# Patient Record
Sex: Female | Born: 1995 | Race: Asian | Hispanic: No | Marital: Single
Health system: Southern US, Community
[De-identification: ages and names within clinical notes are randomized; demographics above are authoritative.]

---

## 2017-09-29 ENCOUNTER — Encounter (HOSPITAL_COMMUNITY): Payer: Self-pay

## 2017-09-29 ENCOUNTER — Emergency Department (HOSPITAL_COMMUNITY)
Admission: EM | Admit: 2017-09-29 | Discharge: 2017-09-29 | Disposition: A | Discharge status: Home / Self Care | Payer: Self-pay | Attending: Emergency Medicine | Admitting: Emergency Medicine

## 2017-09-29 ENCOUNTER — Other Ambulatory Visit: Payer: Self-pay

## 2017-09-29 DIAGNOSIS — Y998 Other external cause status: Secondary | ICD-10-CM | POA: Insufficient documentation

## 2017-09-29 DIAGNOSIS — Y92410 Unspecified street and highway as the place of occurrence of the external cause: Secondary | ICD-10-CM | POA: Insufficient documentation

## 2017-09-29 DIAGNOSIS — Y939 Activity, unspecified: Secondary | ICD-10-CM | POA: Insufficient documentation

## 2017-09-29 DIAGNOSIS — S161XXA Strain of muscle, fascia and tendon at neck level, initial encounter: Secondary | ICD-10-CM | POA: Insufficient documentation

## 2017-09-29 MED ORDER — METHOCARBAMOL 500 MG PO TABS: 500.0000 mg | ORAL_TABLET | Freq: Three times a day (TID) | ORAL | 0 refills | Status: DC | PRN

## 2017-09-29 MED ORDER — METHOCARBAMOL 500 MG PO TABS
1000.0000 mg | ORAL_TABLET | Freq: Once | ORAL | Status: AC
  Administered 2017-09-29: 1000 mg via ORAL
  Filled 2017-09-29: qty 2

## 2017-09-29 MED ORDER — NAPROXEN 500 MG PO TABS: 500.0000 mg | ORAL_TABLET | Freq: Two times a day (BID) | ORAL | 0 refills | Status: DC

## 2017-09-29 MED ORDER — IBUPROFEN 800 MG PO TABS
800.0000 mg | ORAL_TABLET | Freq: Once | ORAL | Status: AC
  Administered 2017-09-29: 800 mg via ORAL
  Filled 2017-09-29: qty 1
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
## 2017-09-29 NOTE — Discharge Instructions (Addendum)
Expect to be stiff and sore for several days.   Naproxen for pain.  Robaxin for muscle aches
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
## 2019-09-30 ENCOUNTER — Encounter: Payer: Self-pay | Admitting: Emergency Medicine

## 2019-09-30 ENCOUNTER — Emergency Department (HOSPITAL_COMMUNITY): Payer: 59

## 2019-09-30 ENCOUNTER — Other Ambulatory Visit: Payer: Self-pay

## 2019-09-30 ENCOUNTER — Emergency Department (HOSPITAL_COMMUNITY)
Admission: EM | Admit: 2019-09-30 | Discharge: 2019-09-30 | Disposition: A | Discharge status: Home / Self Care | Payer: 59 | Attending: Emergency Medicine | Admitting: Emergency Medicine

## 2019-09-30 DIAGNOSIS — Z79899 Other long term (current) drug therapy: Secondary | ICD-10-CM | POA: Diagnosis not present

## 2019-09-30 DIAGNOSIS — S52124A Nondisplaced fracture of head of right radius, initial encounter for closed fracture: Secondary | ICD-10-CM | POA: Diagnosis not present

## 2019-09-30 DIAGNOSIS — M25511 Pain in right shoulder: Secondary | ICD-10-CM | POA: Insufficient documentation

## 2019-09-30 DIAGNOSIS — R519 Headache, unspecified: Secondary | ICD-10-CM | POA: Insufficient documentation

## 2019-09-30 DIAGNOSIS — R6884 Jaw pain: Secondary | ICD-10-CM | POA: Diagnosis not present

## 2019-09-30 DIAGNOSIS — Y999 Unspecified external cause status: Secondary | ICD-10-CM | POA: Diagnosis not present

## 2019-09-30 DIAGNOSIS — Y9389 Activity, other specified: Secondary | ICD-10-CM | POA: Diagnosis not present

## 2019-09-30 DIAGNOSIS — Y92410 Unspecified street and highway as the place of occurrence of the external cause: Secondary | ICD-10-CM | POA: Insufficient documentation

## 2019-09-30 DIAGNOSIS — M79641 Pain in right hand: Secondary | ICD-10-CM | POA: Diagnosis not present

## 2019-09-30 DIAGNOSIS — S59901A Unspecified injury of right elbow, initial encounter: Secondary | ICD-10-CM | POA: Diagnosis present

## 2019-09-30 MED ORDER — HYDROCODONE-ACETAMINOPHEN 5-325 MG PO TABS
1.0000 | ORAL_TABLET | Freq: Once | ORAL | Status: AC
  Administered 2019-09-30: 1 via ORAL
  Filled 2019-09-30: qty 1

## 2019-09-30 NOTE — Discharge Instructions (Signed)
Tylenol and Ibuprofen for pain. Follow up with Orthopedics.  Return for new or worsening symptoms
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
## 2019-09-30 NOTE — ED Notes (Signed)
Patient transported to X-ray 
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
## 2019-09-30 NOTE — ED Notes (Signed)
Patient given discharge instructions patient verbalizes understanding. 

## 2019-10-01 ENCOUNTER — Emergency Department (HOSPITAL_COMMUNITY): Payer: 59

## 2019-10-01 ENCOUNTER — Emergency Department (HOSPITAL_COMMUNITY)
Admission: EM | Admit: 2019-10-01 | Discharge: 2019-10-01 | Disposition: A | Discharge status: Home / Self Care | Payer: 59 | Attending: Emergency Medicine | Admitting: Emergency Medicine

## 2019-10-01 ENCOUNTER — Other Ambulatory Visit: Payer: Self-pay

## 2019-10-01 DIAGNOSIS — R52 Pain, unspecified: Secondary | ICD-10-CM

## 2019-10-01 DIAGNOSIS — R0781 Pleurodynia: Secondary | ICD-10-CM | POA: Insufficient documentation

## 2019-10-01 DIAGNOSIS — S20212D Contusion of left front wall of thorax, subsequent encounter: Secondary | ICD-10-CM | POA: Insufficient documentation

## 2019-10-01 DIAGNOSIS — R1030 Lower abdominal pain, unspecified: Secondary | ICD-10-CM | POA: Insufficient documentation

## 2019-10-01 DIAGNOSIS — M546 Pain in thoracic spine: Secondary | ICD-10-CM | POA: Diagnosis not present

## 2019-10-01 LAB — COMPREHENSIVE METABOLIC PANEL
ALT: 13 U/L (ref 0–44)
AST: 19 U/L (ref 15–41)
Albumin: 3.8 g/dL (ref 3.5–5.0)
Alkaline Phosphatase: 52 U/L (ref 38–126)
Anion gap: 11 (ref 5–15)
BUN: 10 mg/dL (ref 6–20)
CO2: 21 mmol/L — ABNORMAL LOW (ref 22–32)
Calcium: 9.5 mg/dL (ref 8.9–10.3)
Chloride: 106 mmol/L (ref 98–111)
Creatinine, Ser: 0.73 mg/dL (ref 0.44–1.00)
GFR calc Af Amer: >60 mL/min (ref >60)
GFR calc non Af Amer: >60 mL/min (ref >60)
Glucose, Bld: 88 mg/dL (ref 70–99)
Potassium: 4.1 mmol/L (ref 3.5–5.1)
Sodium: 138 mmol/L (ref 135–145)
Total Bilirubin: 0.7 mg/dL (ref 0.3–1.2)
Total Protein: 7.2 g/dL (ref 6.5–8.1)

## 2019-10-01 LAB — CBC WITH DIFFERENTIAL/PLATELET
Abs Immature Granulocytes: 0.01 10*3/uL (ref 0.00–0.07)
Basophils Absolute: 0.1 10*3/uL (ref 0.0–0.1)
Basophils Relative: 1 %
Eosinophils Absolute: 0.1 10*3/uL (ref 0.0–0.5)
Eosinophils Relative: 2 %
HCT: 39.7 % (ref 36.0–46.0)
Hemoglobin: 12.5 g/dL (ref 12.0–15.0)
Immature Granulocytes: 0 %
Lymphocytes Relative: 20 %
Lymphs Abs: 1.8 10*3/uL (ref 0.7–4.0)
MCH: 29.1 pg (ref 26.0–34.0)
MCHC: 31.5 g/dL (ref 30.0–36.0)
MCV: 92.3 fL (ref 80.0–100.0)
Monocytes Absolute: 0.6 10*3/uL (ref 0.1–1.0)
Monocytes Relative: 7 %
Neutro Abs: 6.5 10*3/uL (ref 1.7–7.7)
Neutrophils Relative %: 70 %
Platelets: 280 10*3/uL (ref 150–400)
RBC: 4.3 MIL/uL (ref 3.87–5.11)
RDW: 11.7 % (ref 11.5–15.5)
WBC: 9.1 10*3/uL (ref 4.0–10.5)
nRBC: 0 % (ref 0.0–0.2)

## 2019-10-01 LAB — I-STAT BETA HCG BLOOD, ED (MC, WL, AP ONLY): I-stat hCG, quantitative: <5 m[IU]/mL (ref <5)

## 2019-10-01 MED ORDER — IOHEXOL 300 MG/ML  SOLN
100.0000 mL | Freq: Once | INTRAMUSCULAR | Status: AC | PRN
  Administered 2019-10-01: 100 mL via INTRAVENOUS

## 2019-10-01 MED ORDER — CYCLOBENZAPRINE HCL 10 MG PO TABS: 10.0000 mg | ORAL_TABLET | Freq: Two times a day (BID) | ORAL | 0 refills | Status: AC | PRN
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
